# Patient Record
Sex: Male | Born: 1940 | Race: Black or African American | Hispanic: No | Marital: Single
Health system: Southern US, Community
[De-identification: ages and names within clinical notes are randomized; demographics above are authoritative.]

---

## 2004-12-04 ENCOUNTER — Inpatient Hospital Stay (HOSPITAL_COMMUNITY): Admission: EM | Admit: 2004-12-04 | Discharge: 2004-12-10 | Payer: Self-pay | Admitting: Emergency Medicine

## 2005-06-20 ENCOUNTER — Emergency Department: Payer: Self-pay | Admitting: Emergency Medicine

## 2005-12-06 ENCOUNTER — Other Ambulatory Visit: Payer: Self-pay

## 2005-12-06 ENCOUNTER — Inpatient Hospital Stay: Payer: Self-pay | Admitting: Internal Medicine

## 2005-12-12 ENCOUNTER — Ambulatory Visit: Payer: Self-pay | Admitting: Internal Medicine

## 2005-12-18 ENCOUNTER — Ambulatory Visit: Payer: Self-pay | Admitting: Internal Medicine

## 2006-01-15 ENCOUNTER — Ambulatory Visit: Payer: Self-pay | Admitting: Internal Medicine

## 2006-02-14 ENCOUNTER — Inpatient Hospital Stay: Payer: Self-pay | Admitting: Unknown Physician Specialty

## 2006-02-14 ENCOUNTER — Other Ambulatory Visit: Payer: Self-pay

## 2008-09-06 ENCOUNTER — Inpatient Hospital Stay: Payer: Self-pay | Admitting: Specialist

## 2008-11-07 ENCOUNTER — Encounter: Payer: Self-pay | Admitting: Internal Medicine

## 2008-11-08 ENCOUNTER — Inpatient Hospital Stay: Payer: Self-pay | Admitting: Internal Medicine

## 2008-11-17 ENCOUNTER — Encounter: Payer: Self-pay | Admitting: Internal Medicine

## 2008-12-18 ENCOUNTER — Encounter: Payer: Self-pay | Admitting: Internal Medicine

## 2009-01-15 ENCOUNTER — Encounter: Payer: Self-pay | Admitting: Internal Medicine

## 2009-02-15 ENCOUNTER — Encounter: Payer: Self-pay | Admitting: Internal Medicine

## 2009-02-22 ENCOUNTER — Emergency Department: Payer: Self-pay | Admitting: Unknown Physician Specialty

## 2009-03-20 ENCOUNTER — Ambulatory Visit: Payer: Self-pay | Admitting: Surgery

## 2009-03-22 ENCOUNTER — Ambulatory Visit: Payer: Self-pay | Admitting: Surgery

## 2009-03-28 ENCOUNTER — Inpatient Hospital Stay: Payer: Self-pay | Admitting: Surgery

## 2011-01-09 ENCOUNTER — Inpatient Hospital Stay: Payer: Self-pay | Admitting: Internal Medicine

## 2011-01-21 LAB — PATHOLOGY REPORT

## 2011-11-03 ENCOUNTER — Other Ambulatory Visit: Payer: Self-pay | Admitting: Internal Medicine

## 2011-11-04 ENCOUNTER — Inpatient Hospital Stay: Payer: Self-pay | Admitting: Internal Medicine

## 2011-12-19 ENCOUNTER — Ambulatory Visit: Payer: Self-pay | Admitting: Internal Medicine

## 2011-12-19 ENCOUNTER — Inpatient Hospital Stay: Payer: Self-pay | Admitting: Internal Medicine

## 2011-12-19 LAB — COMPREHENSIVE METABOLIC PANEL
Anion Gap: 11 (ref 7–16)
Calcium, Total: 9.7 mg/dL (ref 8.5–10.1)
Chloride: 107 mmol/L (ref 98–107)
Co2: 25 mmol/L (ref 21–32)
EGFR (African American): >60
EGFR (Non-African Amer.): 59 — ABNORMAL LOW
Glucose: 247 mg/dL — ABNORMAL HIGH (ref 65–99)
Potassium: 4.6 mmol/L (ref 3.5–5.1)
SGOT(AST): 17 U/L (ref 15–37)
SGPT (ALT): 22 U/L
Total Protein: 7.2 g/dL (ref 6.4–8.2)

## 2011-12-19 LAB — AMMONIA: Ammonia, Plasma: <25 mcmol/L (ref 11–32)

## 2011-12-19 LAB — CBC
HCT: 34.1 % — ABNORMAL LOW (ref 40.0–52.0)
HGB: 11.2 g/dL — ABNORMAL LOW (ref 13.0–18.0)
Platelet: 217 10*3/uL (ref 150–440)
RBC: 3.71 10*6/uL — ABNORMAL LOW (ref 4.40–5.90)
WBC: 17 10*3/uL — ABNORMAL HIGH (ref 3.8–10.6)

## 2011-12-19 LAB — URINALYSIS, COMPLETE
Bacteria: NONE SEEN
Bilirubin,UR: NEGATIVE
Glucose,UR: >=500 mg/dL (ref 0–75)
Leukocyte Esterase: NEGATIVE
Ph: 5 (ref 4.5–8.0)
Specific Gravity: 1.015 (ref 1.003–1.030)
Squamous Epithelial: <1

## 2011-12-19 LAB — CK TOTAL AND CKMB (NOT AT ARMC): CK-MB: 2.4 ng/mL (ref 0.5–3.6)

## 2011-12-19 LAB — LIPASE, BLOOD: Lipase: 70 U/L — ABNORMAL LOW (ref 73–393)

## 2011-12-20 LAB — CBC WITH DIFFERENTIAL/PLATELET
Basophil %: 0.2 %
Eosinophil #: 0 10*3/uL (ref 0.0–0.7)
Lymphocyte #: 0.8 10*3/uL — ABNORMAL LOW (ref 1.0–3.6)
Lymphocyte %: 5.9 %
MCHC: 32.9 g/dL (ref 32.0–36.0)
Neutrophil #: 12.6 10*3/uL — ABNORMAL HIGH (ref 1.4–6.5)
RDW: 14 % (ref 11.5–14.5)

## 2011-12-20 LAB — BASIC METABOLIC PANEL
Anion Gap: 15 (ref 7–16)
BUN: 34 mg/dL — ABNORMAL HIGH (ref 7–18)
Calcium, Total: 9.2 mg/dL (ref 8.5–10.1)
Chloride: 109 mmol/L — ABNORMAL HIGH (ref 98–107)
Co2: 23 mmol/L (ref 21–32)
Co2: 26 mmol/L (ref 21–32)
Creatinine: 1.64 mg/dL — ABNORMAL HIGH (ref 0.60–1.30)
Creatinine: 1.8 mg/dL — ABNORMAL HIGH (ref 0.60–1.30)
EGFR (African American): 48 — ABNORMAL LOW
EGFR (African American): 54 — ABNORMAL LOW
EGFR (Non-African Amer.): 40 — ABNORMAL LOW
EGFR (Non-African Amer.): 44 — ABNORMAL LOW
Glucose: 189 mg/dL — ABNORMAL HIGH (ref 65–99)
Glucose: 298 mg/dL — ABNORMAL HIGH (ref 65–99)
Osmolality: 311 (ref 275–301)
Potassium: 4 mmol/L (ref 3.5–5.1)
Sodium: 147 mmol/L — ABNORMAL HIGH (ref 136–145)
Sodium: 149 mmol/L — ABNORMAL HIGH (ref 136–145)

## 2011-12-20 LAB — CK TOTAL AND CKMB (NOT AT ARMC)
CK, Total: 99 U/L (ref 35–232)
CK, Total: 99 U/L (ref 35–232)
CK-MB: 1.7 ng/mL (ref 0.5–3.6)
CK-MB: 1.8 ng/mL (ref 0.5–3.6)

## 2011-12-20 LAB — TROPONIN I: Troponin-I: 0.08 ng/mL — ABNORMAL HIGH

## 2011-12-21 LAB — CBC WITH DIFFERENTIAL/PLATELET
Basophil #: 0 10*3/uL (ref 0.0–0.1)
Lymphocyte #: 1.4 10*3/uL (ref 1.0–3.6)
MCV: 92 fL (ref 80–100)
Monocyte %: 6.4 %
Neutrophil %: 81.7 %
Platelet: 212 10*3/uL (ref 150–440)
RDW: 14.3 % (ref 11.5–14.5)
WBC: 12 10*3/uL — ABNORMAL HIGH (ref 3.8–10.6)

## 2011-12-21 LAB — BASIC METABOLIC PANEL
Anion Gap: 10 (ref 7–16)
Calcium, Total: 9 mg/dL (ref 8.5–10.1)
Co2: 27 mmol/L (ref 21–32)
EGFR (African American): 55 — ABNORMAL LOW
Glucose: 233 mg/dL — ABNORMAL HIGH (ref 65–99)

## 2011-12-22 LAB — CBC WITH DIFFERENTIAL/PLATELET
HCT: 29.7 % — ABNORMAL LOW (ref 40.0–52.0)
Lymphocyte #: 2.2 10*3/uL (ref 1.0–3.6)
Lymphocyte %: 20 %
MCHC: 32.8 g/dL (ref 32.0–36.0)
Monocyte #: 0.9 10*3/uL — ABNORMAL HIGH (ref 0.0–0.7)
Neutrophil #: 7.7 10*3/uL — ABNORMAL HIGH (ref 1.4–6.5)
Platelet: 195 10*3/uL (ref 150–440)
RDW: 13.9 % (ref 11.5–14.5)

## 2011-12-22 LAB — BASIC METABOLIC PANEL
Anion Gap: 10 (ref 7–16)
BUN: 27 mg/dL — ABNORMAL HIGH (ref 7–18)
EGFR (Non-African Amer.): 49 — ABNORMAL LOW
Glucose: 201 mg/dL — ABNORMAL HIGH (ref 65–99)
Osmolality: 309 (ref 275–301)

## 2011-12-23 LAB — CBC WITH DIFFERENTIAL/PLATELET
Basophil #: 0 10*3/uL (ref 0.0–0.1)
Eosinophil #: 0.4 10*3/uL (ref 0.0–0.7)
HGB: 9.4 g/dL — ABNORMAL LOW (ref 13.0–18.0)
Lymphocyte #: 2 10*3/uL (ref 1.0–3.6)
Lymphocyte %: 21.5 %
Monocyte %: 6.1 %
Neutrophil %: 67.7 %
RBC: 3.21 10*6/uL — ABNORMAL LOW (ref 4.40–5.90)
WBC: 9.5 10*3/uL (ref 3.8–10.6)

## 2011-12-23 LAB — BASIC METABOLIC PANEL
Anion Gap: 12 (ref 7–16)
BUN: 27 mg/dL — ABNORMAL HIGH (ref 7–18)
Creatinine: 1.46 mg/dL — ABNORMAL HIGH (ref 0.60–1.30)
EGFR (African American): >60
Osmolality: 294 (ref 275–301)

## 2011-12-24 LAB — BASIC METABOLIC PANEL
BUN: 23 mg/dL — ABNORMAL HIGH (ref 7–18)
EGFR (Non-African Amer.): 57 — ABNORMAL LOW
Glucose: 248 mg/dL — ABNORMAL HIGH (ref 65–99)
Osmolality: 288 (ref 275–301)
Potassium: 3.6 mmol/L (ref 3.5–5.1)
Sodium: 138 mmol/L (ref 136–145)

## 2011-12-24 LAB — URINALYSIS, COMPLETE
Bilirubin,UR: NEGATIVE
Blood: NEGATIVE
Leukocyte Esterase: NEGATIVE
Nitrite: NEGATIVE
Ph: 5 (ref 4.5–8.0)
Squamous Epithelial: NONE SEEN

## 2011-12-24 LAB — CBC WITH DIFFERENTIAL/PLATELET
Eosinophil %: 4.4 %
HGB: 9.4 g/dL — ABNORMAL LOW (ref 13.0–18.0)
MCH: 29.8 pg (ref 26.0–34.0)
MCHC: 32.5 g/dL (ref 32.0–36.0)
MCV: 92 fL (ref 80–100)
Monocyte #: 0.5 10*3/uL (ref 0.0–0.7)
RBC: 3.15 10*6/uL — ABNORMAL LOW (ref 4.40–5.90)

## 2011-12-25 LAB — CBC WITH DIFFERENTIAL/PLATELET
Basophil #: 0 10*3/uL (ref 0.0–0.1)
Basophil %: 0.1 %
Eosinophil #: 0.3 10*3/uL (ref 0.0–0.7)
HCT: 28.6 % — ABNORMAL LOW (ref 40.0–52.0)
HGB: 9.5 g/dL — ABNORMAL LOW (ref 13.0–18.0)
Lymphocyte #: 1.5 10*3/uL (ref 1.0–3.6)
MCH: 30.1 pg (ref 26.0–34.0)
MCHC: 33.2 g/dL (ref 32.0–36.0)
Monocyte #: 0.5 10*3/uL (ref 0.0–0.7)
Neutrophil #: 6.8 10*3/uL — ABNORMAL HIGH (ref 1.4–6.5)
RBC: 3.16 10*6/uL — ABNORMAL LOW (ref 4.40–5.90)
RDW: 13.5 % (ref 11.5–14.5)
WBC: 9.1 10*3/uL (ref 3.8–10.6)

## 2011-12-25 LAB — BASIC METABOLIC PANEL
BUN: 17 mg/dL (ref 7–18)
Creatinine: 1.23 mg/dL (ref 0.60–1.30)
EGFR (Non-African Amer.): >60
Glucose: 238 mg/dL — ABNORMAL HIGH (ref 65–99)
Osmolality: 289 (ref 275–301)
Potassium: 3.5 mmol/L (ref 3.5–5.1)

## 2011-12-25 LAB — HEMOGLOBIN A1C: Hemoglobin A1C: 8.7 % — ABNORMAL HIGH (ref 4.2–6.3)

## 2011-12-30 ENCOUNTER — Emergency Department: Payer: Self-pay | Admitting: *Deleted

## 2011-12-30 LAB — COMPREHENSIVE METABOLIC PANEL
Albumin: 2.9 g/dL — ABNORMAL LOW (ref 3.4–5.0)
Anion Gap: 11 (ref 7–16)
Bilirubin,Total: 0.2 mg/dL (ref 0.2–1.0)
Creatinine: 1.38 mg/dL — ABNORMAL HIGH (ref 0.60–1.30)
Glucose: 237 mg/dL — ABNORMAL HIGH (ref 65–99)
Osmolality: 288 (ref 275–301)
Potassium: 4 mmol/L (ref 3.5–5.1)
SGOT(AST): 20 U/L (ref 15–37)
Sodium: 140 mmol/L (ref 136–145)
Total Protein: 6.5 g/dL (ref 6.4–8.2)

## 2011-12-30 LAB — URINALYSIS, COMPLETE
Ketone: NEGATIVE
Leukocyte Esterase: NEGATIVE
Nitrite: NEGATIVE
Ph: 5 (ref 4.5–8.0)
Protein: 100
WBC UR: 1 /HPF (ref 0–5)

## 2011-12-30 LAB — CBC
MCH: 30.7 pg (ref 26.0–34.0)
MCV: 91 fL (ref 80–100)
Platelet: 194 10*3/uL (ref 150–440)
RDW: 13.8 % (ref 11.5–14.5)

## 2011-12-30 LAB — LIPASE, BLOOD: Lipase: 107 U/L (ref 73–393)

## 2012-01-16 ENCOUNTER — Ambulatory Visit: Payer: Self-pay | Admitting: Internal Medicine

## 2012-04-25 ENCOUNTER — Inpatient Hospital Stay: Payer: Self-pay | Admitting: Internal Medicine

## 2012-04-25 LAB — URINALYSIS, COMPLETE
Bilirubin,UR: NEGATIVE
Glucose,UR: 150 mg/dL (ref 0–75)
Leukocyte Esterase: NEGATIVE
RBC,UR: 5 /HPF (ref 0–5)
Specific Gravity: 1.016 (ref 1.003–1.030)
WBC UR: 3 /HPF (ref 0–5)

## 2012-04-25 LAB — PROTIME-INR
INR: 1
Prothrombin Time: 13.1 secs (ref 11.5–14.7)

## 2012-04-25 LAB — CK TOTAL AND CKMB (NOT AT ARMC)
CK, Total: 1229 U/L — ABNORMAL HIGH (ref 35–232)
CK, Total: 992 U/L — ABNORMAL HIGH (ref 35–232)
CK, Total: 656 U/L — ABNORMAL HIGH (ref 35–232)

## 2012-04-25 LAB — CBC WITH DIFFERENTIAL/PLATELET
Basophil %: 0.1 %
Eosinophil %: 0 %
HGB: 11.3 g/dL — ABNORMAL LOW (ref 13.0–18.0)
Lymphocyte #: 1.5 10*3/uL (ref 1.0–3.6)
Lymphocyte %: 13 %
MCH: 30.1 pg (ref 26.0–34.0)
Monocyte %: 8.5 %
Neutrophil #: 9.2 10*3/uL — ABNORMAL HIGH (ref 1.4–6.5)
RDW: 14.6 % — ABNORMAL HIGH (ref 11.5–14.5)

## 2012-04-25 LAB — COMPREHENSIVE METABOLIC PANEL
Albumin: 3.3 g/dL — ABNORMAL LOW (ref 3.4–5.0)
Bilirubin,Total: 0.4 mg/dL (ref 0.2–1.0)
Co2: 25 mmol/L (ref 21–32)
Creatinine: 1.63 mg/dL — ABNORMAL HIGH (ref 0.60–1.30)
EGFR (African American): 48 — ABNORMAL LOW
Glucose: 158 mg/dL — ABNORMAL HIGH (ref 65–99)
Osmolality: 298 (ref 275–301)
Potassium: 4.4 mmol/L (ref 3.5–5.1)
SGOT(AST): 54 U/L — ABNORMAL HIGH (ref 15–37)
SGPT (ALT): 25 U/L

## 2012-04-26 LAB — CBC WITH DIFFERENTIAL/PLATELET
Basophil #: 0 10*3/uL (ref 0.0–0.1)
Eosinophil %: 0.3 %
HCT: 30.9 % — ABNORMAL LOW (ref 40.0–52.0)
Lymphocyte #: 1.6 10*3/uL (ref 1.0–3.6)
Lymphocyte %: 17 %
MCH: 29.7 pg (ref 26.0–34.0)
Monocyte %: 8.3 %
Neutrophil #: 7 10*3/uL — ABNORMAL HIGH (ref 1.4–6.5)
Platelet: 196 10*3/uL (ref 150–440)
RDW: 14.5 % (ref 11.5–14.5)

## 2012-04-26 LAB — MAGNESIUM: Magnesium: 1.8 mg/dL

## 2012-04-30 LAB — CULTURE, BLOOD (SINGLE)

## 2012-06-17 DEATH — deceased

## 2012-07-10 IMAGING — CT CT HEAD WITHOUT CONTRAST
2 series · 15 of 30 positions shown, 19 images · non-contrast
Comparison: none

REASON FOR EXAM: slurred speech, syncope
COMMENTS:   May transport without cardiac monitor

PROCEDURE:     CT  - CT HEAD WITHOUT CONTRAST  - January 09, 2011  [DATE]
RESULT:     Comparison:  None
TECHNIQUE: Multiple axial images from the foramen magnum to the vertex were
obtained without IV contrast.

[Series 2: without · axial · non-contrast · 0.44mm/px · z∈[+326,+456]mm · 13 of 32 slices shown, 17 images]
[im 3/32  brain]
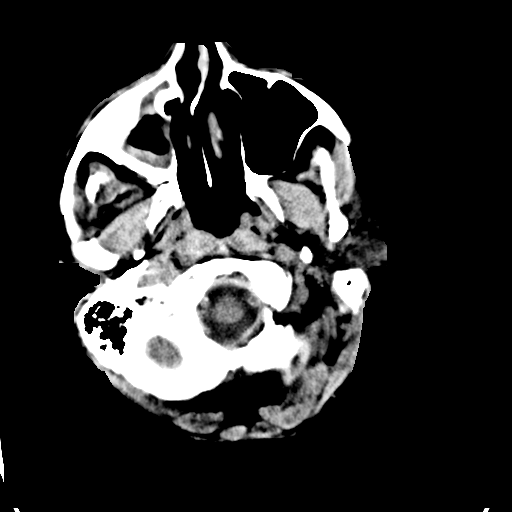
[im 3/32  bone]
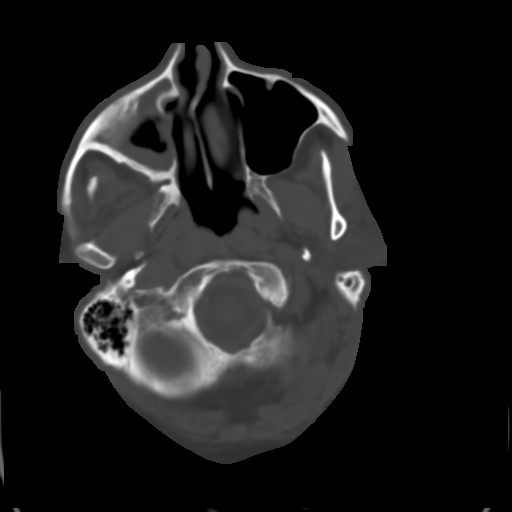
[im 5/32  brain]
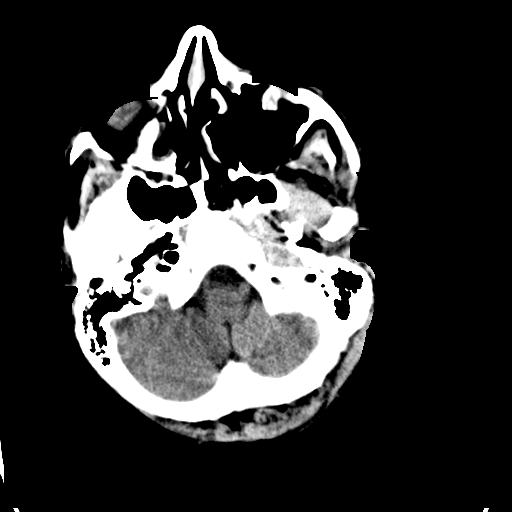
[im 7/32  brain]
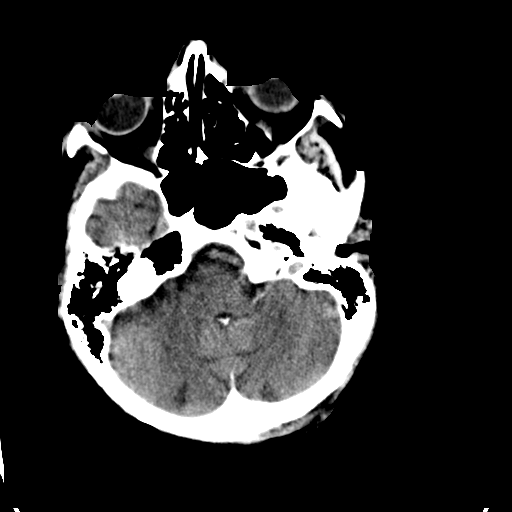
[im 9/32  brain]
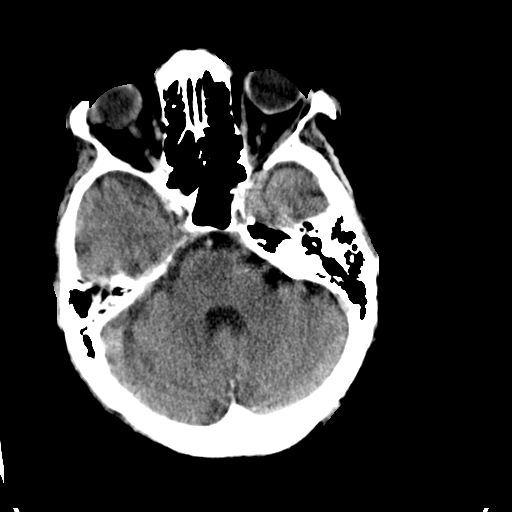
[im 12/32  brain]
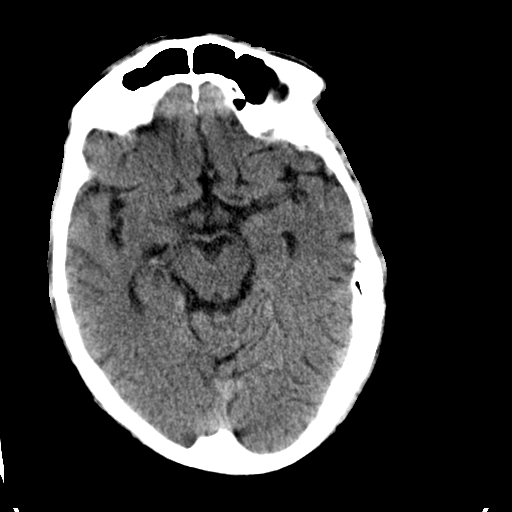
[im 12/32  bone]
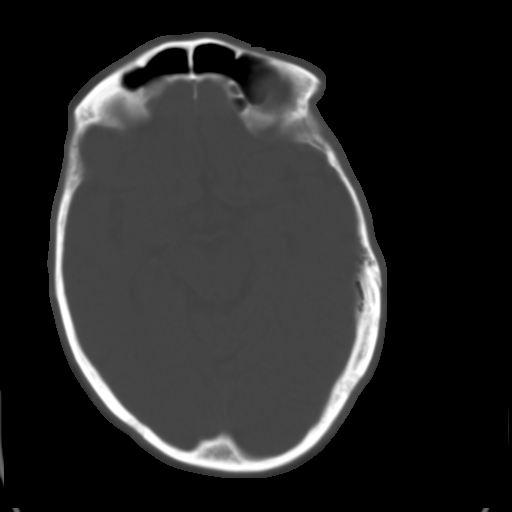
[im 14/32  brain]
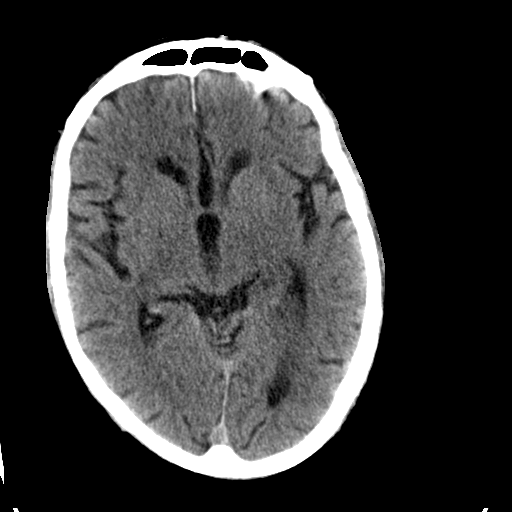
[im 16/32  brain]
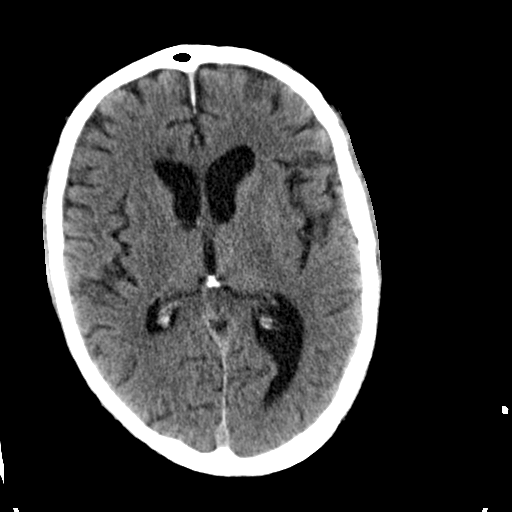
[im 18/32  brain]
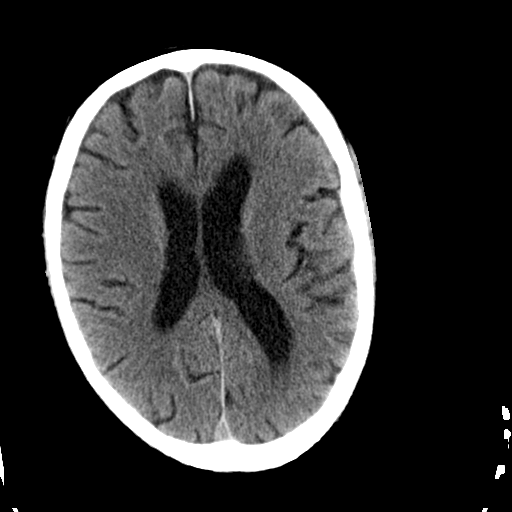
[im 20/32  brain]
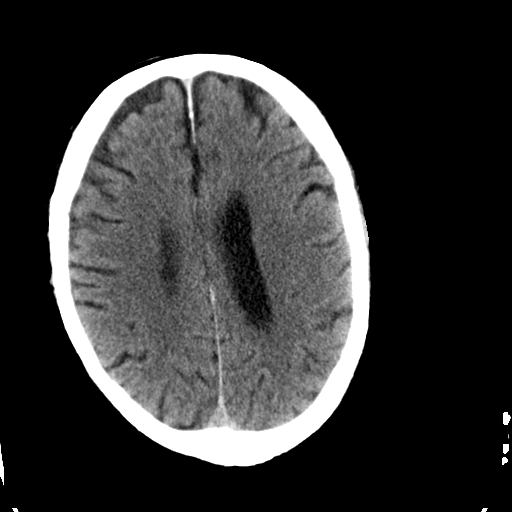
[im 20/32  bone]
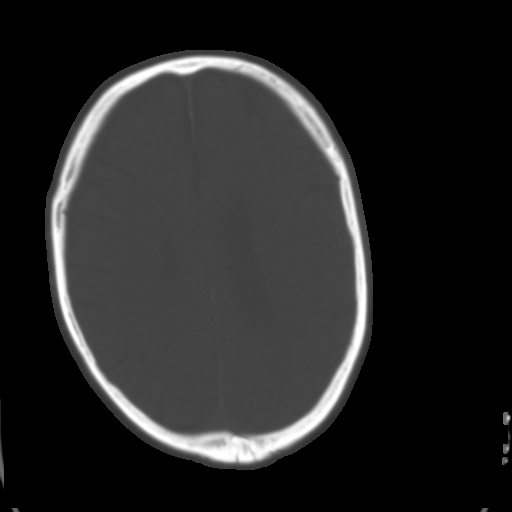
[im 23/32  brain]
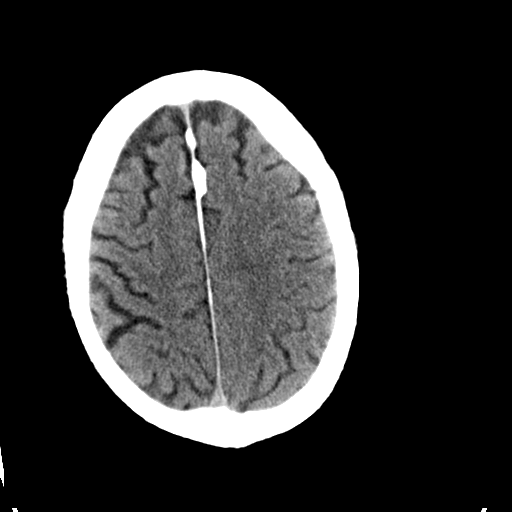
[im 25/32  brain]
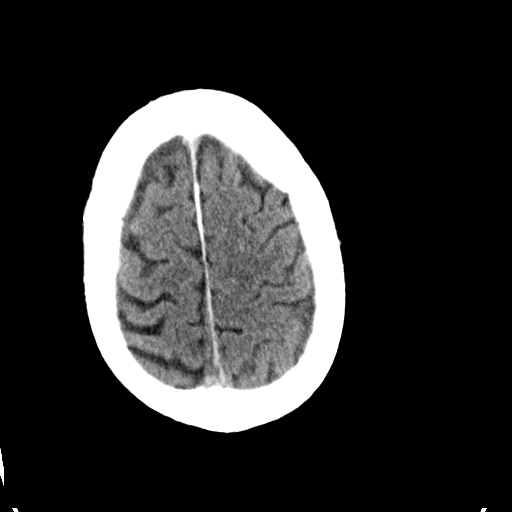
[im 27/32  brain]
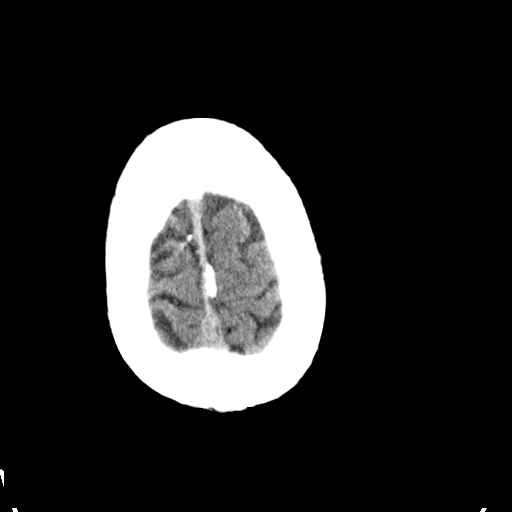
[im 29/32  brain]
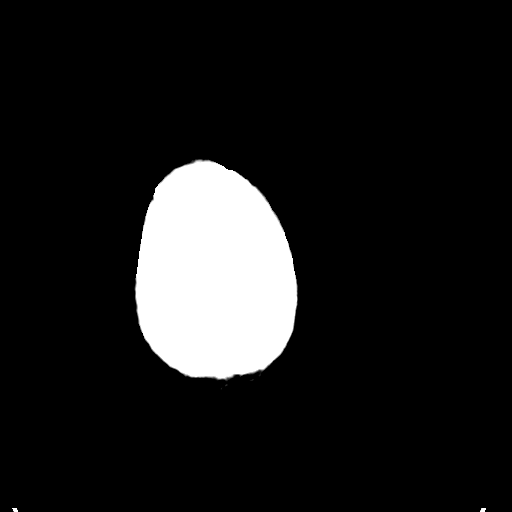
[im 29/32  bone]
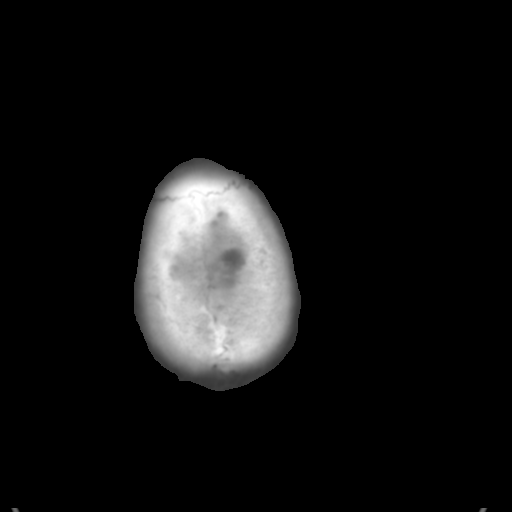

[Series 3: bone · axial · 0.44mm/px · z∈[+326,+346]mm · 2 of 32 slices shown]
[im 3/32  bone]
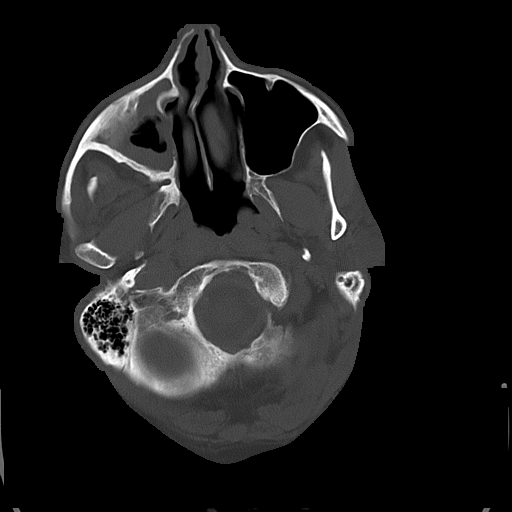
[im 7/32  bone]
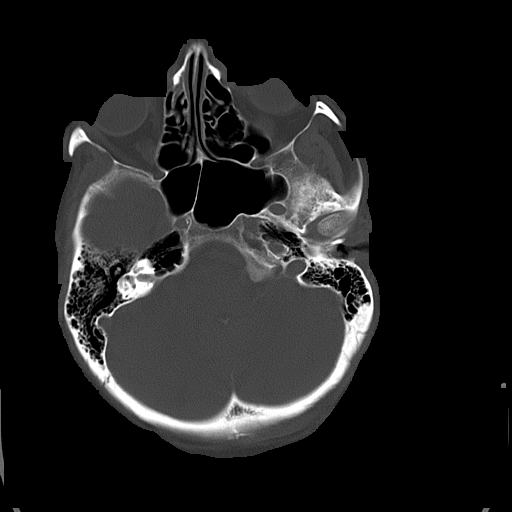

[15 of 30 positions shown; findings below may reference images not displayed]

FINDINGS: There is no evidence for mass effect, midline shift, or extra-axial fluid
collections. There is no evidence for space-occupying lesion, intracranial
hemorrhage, or cortical-based area of infarction. Minimal periventricular
hypoattenuation likely represent sequela of chronic small vessel ischemic
disease. There are calcifications in the posterior circulation.

There is near-complete opacification of the right maxillary sinus.

The osseous structures are unremarkable.
IMPRESSION: 1. No acute intracranial process.
2. Right maxillary sinus disease.

CT can underestimate ischemia in the first 24 hours after the event. If
there is clinical concern for an acute infarct, a followup MRI or repeat CT
scan in 24 hours may provide additional information.

## 2013-10-25 IMAGING — CR DG CHEST 1V PORT
1 series · 1 of 1 positions shown · non-contrast
Comparison: none

REASON FOR EXAM: altered mental status
COMMENTS:

PROCEDURE:     DXR - DXR PORTABLE CHEST SINGLE VIEW  - April 25, 2012  [DATE]
RESULT:     Comparison: 12/22/2011

[ap]
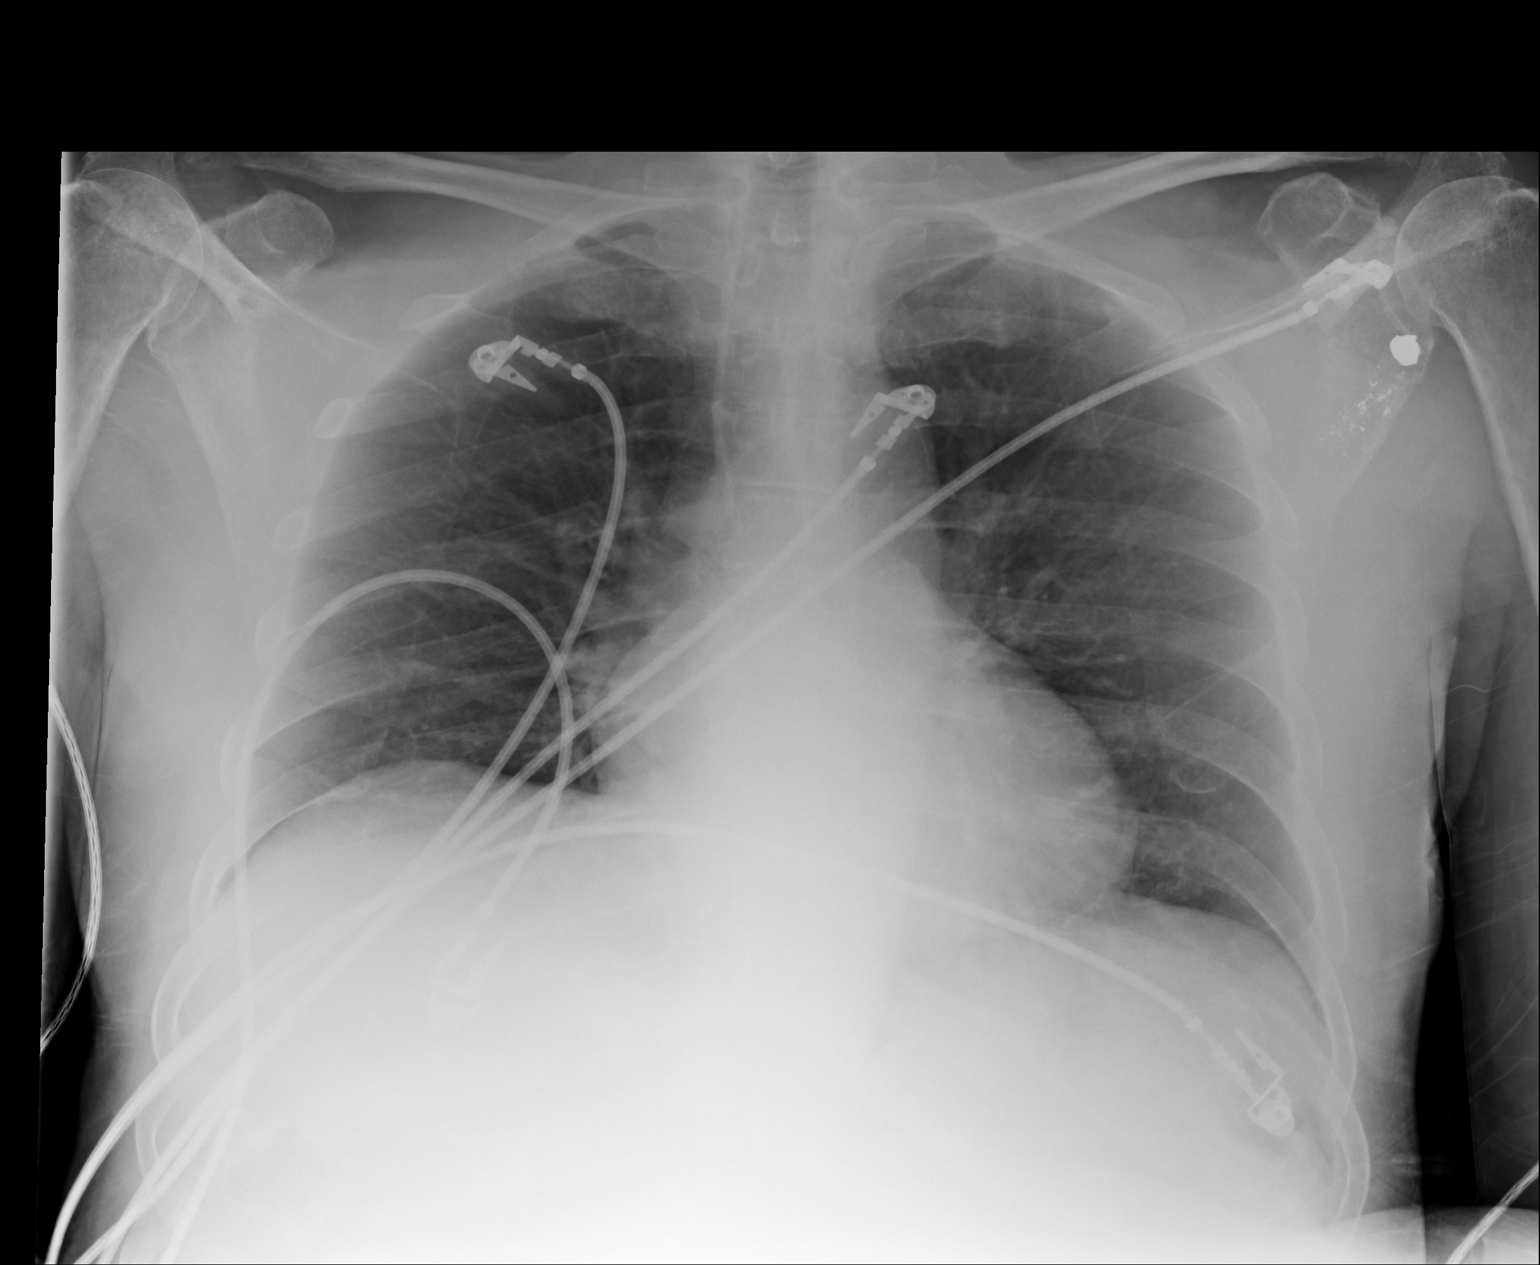

[1 of 1 positions shown; findings below may reference images not displayed]

FINDINGS: Single portable AP chest radiograph is provided.  There is no focal
parenchymal opacity, pleural effusion, or pneumothorax. Normal
cardiomediastinal silhouette. The osseous structures are unremarkable.
IMPRESSION: No acute disease of the che[REDACTED]

## 2015-03-11 NOTE — Discharge Summary (Signed)
PATIENT NAME:  Cameron Garcia, Cameron Garcia MR#:  [id number] DATE OF BIRTH:  12-17-1940  DATE OF ADMISSION:  04/25/2012 DATE OF DISCHARGE:  04/26/2012   ADMISSION DIAGNOSIS: Altered mental status.   DISCHARGE DIAGNOSES: 1. Altered mental status likely secondary to a combination of dementia, medications, and hypertension, accelerated.  2. Accelerated hypertension.  3. Alzheimer's dementia.   CONSULTS: None.   LABORATORIES AT DISCHARGE: White blood cells 9.4, hemoglobin 10, hematocrit 31, platelets 196. INR 1.0. Magnesium 1.8.   Chest x-ray showed no acute cardiopulmonary disease.   CT of the head showed no acute intracranial hemorrhage or CVA.   Blood cultures and urine culture were negative to date.   HOSPITAL COURSE: The patient is a 74 year old male who was sent from the [facility] with altered mental status. For further details, please refer to the history and physical.  1. Altered mental status concerning for hypertension encephalopathy versus NMS versus serotonin syndrome as the patient was on Effexor and trazodone. Dr. Guss Bundehalla from Psychiatry was consulted. She felt that perhaps this could be due to his medications, however, so she wanted to discontinue all of the psych meds including Effexor, trazodone, and anti-psychotics. However, it does seem that the patient has baseline tremors and rigidity. His CPK was elevated. This could be due primarily to rhabdomyolysis. However, Dr. Guss Bundehalla did feel that the patient may have neuroleptic malignant syndrome. His CT of the head was negative. Chest x-ray and urinalysis were also negative as well as blood cultures. The patient is now at his baseline.  2. Elevated CPK, rhabdo versus psych meds. CPK has been trending down with IV fluids.  3. Hypertension encephalopathy. Blood pressure noted to be elevated which could be contributing to problem #1. CT of the head is negative. His blood pressure is controlled.  4. Dementia with mood swings.   5. Chronic pain, on fentanyl.  6. Diabetes. The patient can resume his outpatient medications.   DISCHARGE MEDICATIONS: The patient will stop taking trazodone 100 mg at bedtime, clonazepam, Effexor, Haldol, and Risperdal for now.  The patient will be discharged with:  1. Clonidine 0.2 mg once a week on Tuesday along with clonidine 0.3 mg for a total of 0.5 mg. 2. Hydralazine 100 mg t.i.d.   3. Norvasc 10 mg at bedtime.  4. Atorvastatin 20 mg at bedtime.  5. Pantoprazole 40 mg daily.  6. Bethanechol 10 mg 1 tablet t.i.d.  7. Magnesium oxide 400 mg b.i.d.  8. Depakote 250 1 tablet b.i.d.  9. Tamsulosin 0.4 mg daily.  10. Nitroglycerin 0.2 transdermal film patch on in the morning and off in the evening.  11. Enalapril 20 mg daily.  12. Tylenol 325 2 tablets q.4 hours p.r.n. pain.  13. Vitamin D3 1000 international units daily.  14. Lantus 10 units at bedtime.  15. Sliding scale insulin as directed.  16. Metoprolol 100 mg daily.  17. Fentanyl patch 25 mcg q.72 hours.   DISCHARGE DIET: Mechanical soft.   DISCHARGE ACTIVITY: As tolerated.   DISCHARGE REFERRAL: Physical therapy.  FOLLOW-UP: Follow-up with Dr. Toy CookeyErnest Garcia in one week.      TIME SPENT: Approximately 35 minutes.  ____________________________ Janyth ContesSital P. Juliene PinaMody, MD spm:drc D: 04/26/2012 13:44:50 ET T: 04/26/2012 14:15:01 ET JOB#: [id number]  cc: Taliyah Watrous P. Juliene PinaMody, MD, <Dictator> Serita ShellerErnest B. Maryellen PileEason, MD Janyth ContesSITAL P Francis Yardley MD ELECTRONICALLY SIGNED 04/28/2012 12:27

## 2015-03-11 NOTE — Consult Note (Signed)
Comments   Palliative Care team met with pt's sisters, Rick Duff and Marolyn Haller, who are shared HCPOA's (document in [facility]). Also present were other family members/friends. Updated them on pt's current medical status. Pt is much more alert this afternoon and eating. Family feels that he is approaching his baseline. They plan for pt to return to [facility] at discharge. We discussed code status. Pt's living will is also in SCM. Sisters say that pt wants to be a DNR and they are in agreement with this. Order entered and out-of-facility DNR completed and placed in pt's  chart.  expressed appreciation for meeting. All questions answered.   Electronic Signatures: Adreonna Yontz, Izora Gala (MD)  (Signed 04-Feb-13 15:59)  Authored: Palliative Care   Last Updated: 04-Feb-13 15:59 by Zaakirah Kistner, Izora Gala (MD)

## 2015-03-11 NOTE — Discharge Summary (Signed)
PATIENT NAME:  Cameron Garcia, Cameron Garcia MR#:  [id number] DATE OF BIRTH:  [birth year]  DATE OF ADMISSION:  12/19/2011 DATE OF DISCHARGE:  12/25/2011  ADMITTING PHYSICIAN: Enedina Finner, MD  DISCHARGING PHYSICIAN: Larena Glassman, MD  PRIMARY CARE PHYSICIAN: Toy Cookey, MD  REASON FOR ADMISSION: Altered mental status, lethargy.   DISCHARGE DIAGNOSES:  1. Right lower lobe pneumonia. 2. Altered mental status and lethargy, most likely due to pneumonia.  3. Systemic inflammatory response syndrome. 4. Metabolic encephalopathy. 5. Hypernatremia. 6. Dysphagia. 7. Acute renal failure, resolved.  8. Anemia. 9. Chronic gastrointestinal blood loss.  10. Elevated troponin, likely due to anemia, tachycardia, and sepsis.  11. Accelerated hypertension.  12. Hyperglycemia/diabetes.  A1c was found to be 8.7.  13. Urinary retention.   CONSULTANTS:  1. Palliative Care.  2. Case Management.  3. Physical Therapy.  4. Occupational Therapy.  5. Speech and Language Pathology.   TESTS DURING HOSPITALIZATION: Chest x-ray on 12/19/2011 showed right lower lobe airspace disease concerning for pneumonia.   CT of the head without contrast on 12/19/2011 showed no acute intracranial process.   Repeat chest x-ray on 12/22/2011 showed hazy right lower lobe airspace disease with appearance concerning for pneumonia.   HOSPITAL COURSE: Initial history and physical were done by Dr. Allena Katz. Please refer to her note dated 12/19/2011 for complete details. In brief, this is a 74 year old African American male with history of multiple medical problems which include pneumonia in the past, encephalopathy, acute renal failure, anemia of chronic blood loss, esophageal strictures, coronary artery disease, and dementia who presented with lethargy, low grade fever, and tachycardia, and was found to have right lower lobe pneumonia, possible aspiration. The patient was admitted to the hospitalist service.  1. Systemic inflammatory response  syndrome from right lower lobe pneumonia from aspiration: The patient was continued on oxygen. He received seven days of Zyvox. He was on Zosyn and switched over to [place], which he received five days of to cover aspiration pneumonia. WBC was normalized. He had no fever. After initiation of antibiotics, the patient had cultures of the blood which showed no growth. His mental status improved. He did have some dysphagia with regular diet. Speech and Language recommended dysphagia diet, honey-thickened. On discharge the patient was switched over to Augmentin 500 mg p.o. twice a day.  2. Altered mental status, likely metabolic encephalopathy: We monitored and this improved with treatment of his pneumonia.  3. Hypernatremia: This improved with D5 water. 4. Dysphagia: Speech and swallow evaluation recommended dysphagia I, pureed honey-thickened diet. 5. Acute renal failure: We avoided nephrotoxins, most likely from systemic inflammatory response syndrome. This improved with IV fluids and avoiding nephrotoxins.  6. Anemia from chronic gastrointestinal blood loss: This was stable. 7. Elevated troponin, likely due to anemia and tachycardia from demand ischemia or sepsis: No anticoagulation was required as this was minor elevation. 8. Accelerated hypertension: The patient was on labetalol, hydralazine, clonidine patch, and Norvasc. This improved.  9. Hyperglycemia: Hemoglobin A1c was found to be 8.7. The patient was resumed on his Lantus and sliding scale insulin.  10. Palliative Care evaluated the patient and the patient was made DO NOT RESUSCITATE. 11. DVT prophylaxis: He was maintained with SCDs. 12. Urinary retentiion.  Patient had [name] retention started flomax will discharge with foley.  Would try remvoing foley ion 1-2 days and do bladder scans.    PHYSICAL EXAMINATION:   VITALS: The patient was discharged on 12/25/2011 with a temperature of 97.1, heart rate 81, respiratory rate 19, blood pressure is  now 145/87, and he is saturating 98% on room air.   LUNGS: Clear to auscultation.   CARDIOVASCULAR: Regular rate and rhythm.   ABDOMEN: Benign.   DISCHARGE MEDICATIONS:  1. Atorvastatin 20 mg daily.  2. Catapres-TTS 0.3 mg transdermal one patch once a week.  3. Protonix 40 mg daily.  4. Zofran 4 mg every six hours as needed for nausea and vomiting. 5. Insulin glargine 10 units subcutaneous once at bedtime.  6. Insulin aspartate 100 units/mL; blood sugar 151 to 200 two units, 201 to 250 four units, 251 to 300 six units, 301 to 350 eight units, 351 to 400 ten units. 7. Risperidone 1 mg every 12 hours as needed for psychosis. 8. Risperdal Consta 50 mg every two weeks intramuscular  injection, one syringe, 50 mg IM every two weeks at 9 o'clock  a.m. 9. Trazodone 50 mg 1 tablet once a day. 10. Hydralazine 100 mg three times daily. 11. Nitroglycerin 0.2 mg transdermal one patch once a day, off in the evening for angina, rotate.  12. Vitamin D3 50,000 international units 1 tablet monthly. 13. Sertraline 50 mg in the morning.  14. Oxygen 2 liters.  15. Tylenol 325 mg 2 tablets every four hours as needed for pain.  16. Ventolin HFA 90 mcg inhaled 2 puffs every four hours as needed.  17. Albuterol ipratropium 2.5/0.5 mg, 3 mL one vial nebulizer four times daily.  18. Amlodipine 10 mg daily.  19. Toprol-XL 100 mg twice a day. 20. Enalapril 20 mg daily. 21. Ferrous sulfate 325 mg one tablet twice a day.  22. Vitamin D 50,000 international units once a month.  23. Augmentin 500 mg p.o. twice a day, #14 tablets.  24. Mucinex 600 mg p.o. twice a day, #14 tablets.  25. Flomax 0.4mg  daily   OXYGEN: 2 liters nasal cannula prn.  Foley Catheter: try to remvove in -12 days , do bladder scan after removing.   DISCHARGE DIET: ADA, puree, renal diet, dysphagia I, puree/ honey thickened.   ACTIVITY: As tolerated with assistance.   CODE STATUS: DO NOT RESUSCITATE.   DISCHARGE FOLLOWUP: The patient  is to see Dr. Toy CookeyErnest Eason in one week.  Thank you for asking me to participate in the care of this patient.   TIME SPENT ON DISCHARGE: 50 minutes.  ____________________________ Corie ChiquitoAmir A. Lafayette DragonFirozvi, MD aaf:slb D: 12/25/2011 10:53:51 ET T: 12/25/2011 11:14:02 ET JOB#: [id number]  cc: Karolee OhsAmir A. [place] DragonFirozvi, MD, <Dictator> Serita ShellerErnest B. Maryellen PileEason, MD Ned GraceNancy Phifer, MD Coralyn PearAMIR A Caydan Mctavish MD ELECTRONICALLY SIGNED 12/26/2011 10:13

## 2015-03-11 NOTE — H&P (Signed)
PATIENT NAME:  Carolynn Garcia, Cameron MR#:  [id number] DATE OF BIRTH:  08/05/1941  DATE OF ADMISSION:  12/19/2011  PRIMARY CARE PHYSICIAN: Toy CookeyErnest Eason, MD [facility] Previously her PCP was [facility].   CHIEF COMPLAINT: History is obtained from the patient's old records, nursing home notes, and the patient's family. The patient is lethargic and unable to give any review of systems or  history.   HISTORY OF PRESENT ILLNESS: Mr. Cameron Garcia is a 74 year old African American gentleman with multiple medical problems who comes to the Emergency Room from [facility] for fever, responding well, and not being his own self. He has been having some symptoms of cough for the past few days. In the Emergency Room, the patient was found to have fever of 99.3, he was tachycardic with heart rate in the 130s, his white count was 17.2, and chest x-ray showed right lower lobe pneumonia. He was recently admitted 12/18 to 11/09/2011 with similar symptoms of pneumonia and had evaluation done by speech therapy which did not show any signs of overt aspiration. He also underwent endoscopy at that time with esophageal stricture with dilatation by Dr. Bluford Kaufmannh.  The patient is very lethargic, moans and groans on sternal rub. He does not give any history. He has received IV Zosyn and IV Zyvox for possible aspiration pneumonia versus healthcare acquired pneumonia. The patient is being admitted for further evaluation and management.   PAST MEDICAL HISTORY:  1. Right lower lobe pneumonia, admitted from 12/18 to 11/09/2011.  2. Encephalopathy secondary to pneumonia.  3. Acute on chronic renal failure.  4. Anemia due to chronic blood loss, status post EGD without any evidence of upper GI bleed. The patient underwent esophageal stricture dilatation. Colonoscopy was not done and needs to be done at a later date. 5. Esophageal stricture status post dilatation, 11/08/2011.  6. Coronary artery  disease.  7. History of cerebrovascular accident with dysphagia. 8. Protein calorie malnutrition.  9. Hyperlipidemia.  10. Occlusion and stenosis of carotid arteries status post cerebrovascular accident.  11. Vitamin D deficiency.  12. Type 2 diabetes.   PAST SURGICAL HISTORY: (Per old records) 1[facility]  2. Multiple surgeries for diverticular disease.  3. Colostomy in 2009.  4. Reversal of colostomy in May 2010. 5. Surgery Fournier's gangrene.  ALLERGIES: Ativan.   MEDICATIONS:  1. Risperidone 1 mg twice a day. 2. Sertraline 50 mg one in the morning.  3. Toprol-XL 100 mg twice a day. 4. Trazodone 50 mg at bedtime.  5. Tylenol 325 mg 2 tablets every four hours as needed.  6. Ventolin HFA 90 mcg 2 puffs inhaled every four hours as needed.  7. Vitamin D3 50,000 international units once a month. 8. Vitamin D 50,000 international units oral capsule once a month.  9. DuoNebs every 4 hours p.r.n.  10. Amlodipine 10 mg daily.  11. Atorvastatin 20 mg daily.  12. Catapres TTS 0.3, one patch transdermal once a week.  13. Enalapril 20 mg daily.  14. Ferrous sulfate 325 mg p.o. twice a day.  15. Hydroxyzine 100 mg three times daily. 16. Insulin sliding scale.  17. Insulin glargine (Lantus) 10 units at bedtime.  18. Nitroglycerin 0.2 mg/hour transdermal film daily, in the morning and off in the evening.  19. Zofran 4 mg as needed.  20. Oxygen 2 liters per minute.  21. Protonix 40 mg daily.  22. Promethazine suppository 25 mg as needed.  23. Risperdal 50 mg intravascular injection one  injection every two weeks.   REVIEW OF SYSTEMS:  Unobtainable.   FAMILY HISTORY: Obtained from old records suggestive for coronary artery disease.   PHYSICAL EXAMINATION:   GENERAL: The patient is lethargic, responds with moaning and coughing with sternal rub.   VITAL SIGNS: Temperature 99.3, pulse 132, blood pressure 109/67, and saturation 99% on room air.   HEENT: Atraumatic,  normocephalic. Pupils are equal, round, and reactive to light and accommodation. Extraocular movements intact. Oral mucosa is dry.   NECK: Supple. No JVD. No carotid bruit.   RESPIRATORY: Decreased breath sounds in the bases. No respiratory distress, labored breathing, audible wheezing, or crackles heard.   CARDIOVASCULAR: Tachycardia present. Both heart sounds are normal. No murmur heard.  ABDOMEN: Soft, benign. No organomegaly noted. Hyperactive bowel sounds.   NEURO: Unable to assess. The patient is lethargic, moves extremities spontaneously.   SKIN: Warm and dry.   PSYCHIATRIC: Unable to assess. The patient is lethargic.   LABS/STUDIES: Chest x-ray is suggestive of developing right lower lobe infiltrate.   CT of the head without contrast shows no acute abnormality.   Urinalysis is negative for urinary tract infection.   White count 17.0, hemoglobin and hematocrit 11.2 and 34.1, platelet count 217. Lipase 17, glucose 247, BUN 33, creatinine 1.28, sodium 143, potassium 4.6, chloride 107, bicarbonate 25, calcium 9.7, bilirubin 0.3, alkaline phosphatase 94, SGPT 22, SGOT 17. Cardiac enzymes, first set, is negative, except troponin of 0.07.   ASSESSMENT: 74 year old Mr. Cameron Garcia with:  1. Systemic inflammatory response syndrome due to right lower lobe pneumonia, suspect aspiration versus health-care associated pneumonia.  2. Encephalopathy suspected due to pneumonia versus medications. 3. Anemia of chronic disease, hemoglobin stable.  4. Dementia, appears vascular.  5. Previous history of cerebrovascular accident with symptoms of dysphagia, chronic.  6. Esophageal stricture status post dilatation on 11/08/2011 by Dr. Bluford Kaufmann. 7. Coronary artery disease, medical treatment.  8. Type 2 diabetes.   PLAN:  1. Admit the patient to medical floor, on telemetry. 2. FULL CODE for now.  3. IV fluids for hydration.  4. NPO except medications.  5. We will check plasma ammonia levels.  6. Follow  blood cultures and then follow CBC and metabolic panel.  7. Continue IV Zyvox and Zosyn. Once the patient clinically improves, narrow down antibiotics. 8. I will hold off on p.o. medications right now. The patient is very lethargic. We will resume once the patient is alert.  9. Speech therapy again for possible aspiration given location of pneumonia.  10. Physical therapy.  11. Care management for discharge planning.  12. We will have palliative care consultation for recurrent admissions and similar problems and goals of treatment.              The above plan was discussed with the patient's sister, Theora Gianotti, who is the HPOA [phone]). She understands the patient's complex medical problems and critical illness.   CRITICAL TIME SPENT: 60 minutes. ____________________________ Wylie Hail Allena Katz, MD sap:slb D: 12/19/2011 19:54:59 ET T: 12/20/2011 08:30:41 ET JOB#: [id number]  cc: Marcie Shearon A. Allena Katz, MD, <Dictator> Serita Sheller. Maryellen Pile, MD Willow Ora MD ELECTRONICALLY SIGNED 12/20/2011 15:42

## 2015-03-11 NOTE — Consult Note (Signed)
PATIENT NAME:  Cameron Garcia, Cameron Garcia MR#:  [id number] DATE OF BIRTH:  08-Jul-1941  DATE OF CONSULTATION:  11/07/2011  REFERRING PHYSICIAN:  Dr. Juliene Pina with [facility]  CONSULTING PHYSICIAN:  Dwayne D. Callwood, MD  INDICATION: Possible non-Q-wave myocardial infarction, renal insufficiency.   HISTORY OF PRESENT ILLNESS: Cameron Garcia is a 74 year old male with severe dementia, cerebrovascular accident residual aphasia, hypertension, chronic renal disease stage III, resident of a nursing home who presented with complaints of low hemoglobin of 6.5; got a transfusion. Over 24 hours after transfusion he had some respiratory problems with low oxygenation and was treated with Solu-Medrol and clindamycin and Levaquin but was sent for further evaluation. Patient appeared to be lethargic but history is difficult because of his dementia and his aphasia so he was admitted with what appeared to be lethargy, altered mental status, shortness of breath, anemia.   REVIEW OF SYSTEMS: No blackout spells, syncope. No nausea, vomiting. No fever. No chills. No sweats. No weight loss. No weight gain. No hemoptysis, hematemesis. No bright red blood per rectum. He complained of shortness of breath, weakness, fatigue.   PAST MEDICAL HISTORY:  1. Dysphagia. 2. Cerebrovascular accident. 3. Hypertension. 4. Renal insufficiency. 5. Peripheral disease. 6. Protein calorie malnutrition.  7. Hyperlipidemia.  8. Reflux.  9. Coronary artery disease.  10. Diabetes.  MEDICATIONS: 1. Aspirin 81 mg daily.  2. Atorvastatin 20 a day.  3. Catapres TTS weekly.  4. DuoNebs every four hours p.r.n.  5. Ferrous sulfate 325 mg daily.  6. Fleets enema. 7. Hydralazine 100 mg 3 times a day. 8. Lantus 15 at bedtime. 9. Magnesium oxide 400 twice a day.  10. Levaquin once a day.  11. Nitroglycerin patch 0.2 daily. 12. Norvasc 10 a day.  13. Sliding scale insulin.  14. Plavix 75 a day.  15. Protonix 40. 16. Zantac 150 twice a day.   17. Risperdal 0.25 twice a day and 0.5 mg at bedtime.  18. Toprol 100 daily.  19. Vasotec 20 a day.  20. Vitamin D 5000 units. 21. Zoloft 50 daily. 22. Haldol 0.2. q.2 hours p.r.n.  23. 2 liters of oxygen.  24. Tylenol 650 q.4 p.r.n.   FAMILY HISTORY: Positive for coronary artery disease.   SOCIAL HISTORY: Resident of a nursing home. No smoking, alcohol consumption. Used to smoke cigarettes and marijuana but no alcohol.   PAST SURGICAL HISTORY: 1. Colectomy at [facility]. 2. Diverticular disease. 3. Diverting colostomy. 4. Reversal of colostomy.   PHYSICAL EXAMINATION: VITAL SIGNS: Blood pressure 150/70, pulse 99, respiratory 16, afebrile.   HEENT: Normocephalic, atraumatic. Pupils reactive to light.   NECK: Supple. No jugular venous distention, bruits, adenopathy.   LUNGS: Clear with bilateral rhonchi, some faint expiratory wheezing. No rales.   HEART: Distant heart sounds. No significant murmur, gallops, or rubs.   ABDOMEN: Benign.   EXTREMITY: Within normal limits.   NEUROLOGIC: Grossly intact. He has aphasia so exam was difficult. He had 4/5 strength in his legs.   LABORATORY, DIAGNOSTIC AND RADIOLOGICAL DATA: White count 7, hemoglobin 6.5 initially, hematocrit 20, platelet count 250. LFTs are negative. Sodium 141. Troponin increased to 3. Hemoglobin improved after transfusion. PT/INR normal. Chest x-ray shows consolidate, infiltrate in the right lower lobe suggestive of pneumonia. EKG: Normal sinus rhythm with slight sinus tachycardia, nonspecific findings.   ASSESSMENT:  1. Possible non-Q-wave myocardial infarction. 2. Pneumonia. 3. Peripheral disease. 4. History of transient ischemic attack. 5. History of cerebrovascular accident. 6. Renal insufficiency with creatinine of 1.7.  7. Diabetes.  8.  Dementia.  9. Hypertension.  10. Hyperlipidemia.  11. Anemia possible from GI bleeding.   PLAN: Agree with admit. Agree with GI work-up. Follow up cardiac enzymes. Follow  up EKG. I am concerned that he may need echocardiogram. Would hold off on anticoagulation. Follow up troponins. Do not recommend cardiac catheterization now because of comorbid disease. I think if we treat him medically with calcium blockers and beta blockers he should be an acceptable risk for EGD. Because of his profound anemia I think EGD is warranted. I do not believe cardiac catheterization would add significantly to his prognosis and treatment. I do not believe would recommend angioplasty or bypass so we will treat the patient medically anyway. Again GI should proceed with scope at their convenience whether now or later on. Continue to hold ACE inhibitor because of renal influence. May discontinue Plavix and discontinue aspirin because of the bleeding. Continue diabetes management. Base further evaluation on how he does but recommend medical therapy for his elevated troponins and do not recommend any further medical or cardiology therapy beyond continuing beta blockers pre- and postendoscopy.    ____________________________ Bobbie Stackwayne D. Juliann Paresallwood, MD ddc:cms D: 11/07/2011 13:39:51 ET T: 11/07/2011 15:26:12 ET JOB#: [id number]  cc: Dwayne D. Juliann Paresallwood, MD, <Dictator> Alwyn PeaWAYNE D CALLWOOD MD ELECTRONICALLY SIGNED 11/27/2011 5:57

## 2015-03-11 NOTE — Discharge Summary (Signed)
PATIENT NAME:  Cameron Garcia, Cameron Garcia MR#:  [id number] DATE OF BIRTH:  17-Sep-1941  DATE OF ADMISSION:  11/04/2011 DATE OF DISCHARGE:  11/09/2011  ADMITTING DIAGNOSES: Lethargy, anemia, and pneumonia with hypoxia.   DISCHARGE DIAGNOSES:  1. Shortness of breath due to likely aspiration pneumonia status post treatment with Zosyn and Levaquin. His WBC normalized. His last chest x-ray done on 11/08/2011 shows resolving pneumonia. The patient is currently off oxygen. 2. Acute encephalopathy likely due to combination of pneumonia as well as severe anemia, now the patient is at baseline.  3. Acute on chronic renal failure. The patient's renal function is back to baseline.  4. Anemia possibly due to chronic blood loss with component of anemia of chronic disease status post esophagogastroduodenoscopy with no evidence of upper gastrointestinal bleed. The patient will be reevaluated as an outpatient for possible colonoscopy.  5. Esophageal stricture status post dilation during esophagogastroduodenoscopy. 6. Elevated troponin felt to be due to the patient's severe anemia, possible demand ischemia, status post evaluation by Dr. Juliann Paresallwood who recommended continuing medical regimen. Due to the patient's gastrointestinal bleeding, he felt that the patient would not be able to be placed on any antiplatelets. Therefore doing a cardiac catheterization would not help.  7. Possible urinary tract infection on presentation. Cultures not obtained; however, any bacteria should have been covered with treatment for pneumonia.  8. Hypertension.  9. Previous history of cerebrovascular accident with dysphagia.  10. Peripheral angiopathy.  11. Protein caloric malnutrition.  12. Hyperlipidemia.  13. History of reflux esophagitis.  14. Occlusion and stenosis of the carotid arteries status post cerebrovascular accident.  15. Coronary artery disease.  16. Vitamin D deficiency.  17. Diabetes type 2.   CONSULTANTS: 1. Barnetta ChapelMartin Skulskie,  MD. 2. Lutricia FeilPaul Oh, MD. 3. Dorothyann Pengwayne Callwood, MD.  LABS/STUDIES: Admitting WBC count 7.0, hemoglobin 6.5, and platelet count 215. Sodium 141, potassium 4.5, chloride 109, bicarbonate 20, BUN 41, creatinine 1.70, glucose 147, and calcium 9.2. LFTs otherwise were normal, except albumin of 3.3. INR 1.2. Troponin 0.84.   Chest x-ray showed consolidative infiltrate of the right lower lobe.   EKG showed sinus tachycardia. No ST-T wave changes.   Most recent hemoglobin was 9.5, on 11/09/2011. The patient's creatinine level on 11/06/2011 was 1.30. Troponin got as high as 3.01. Ferritin 75. Iron level was low at 26.   HOSPITAL COURSE: Please see the History and Physical done by the admitting physician. The patient is a 74 year old African American male who has a history of coronary artery disease and previous cerebrovascular accident who was sent from the skilled nursing facility for lethargy, hypoxia, and anemia. The patient was evaluated in the ED and was noted to have a hemoglobin of 6.5. He was also noticed to have a right lower lobe pneumonia. Therefore, we were asked to admit the patient. Due to his history of dysphagia, previous cerebrovascular accident, and his lethargy there was concern for aspiration pneumonia so he was treated with Zosyn and subsequently Levaquin was added. With these treatments the patient's pulmonary symptoms resolved. His recent chest x-ray repeated on 11/08/2011 shows improvement in the right lower lobe infiltrate. The patient otherwise is doing much better. He was also noted to be anemic on presentation. He had iron deficiency anemia. He was also noted to have guaiac-positive stools. He was seen by Gastroenterology and he underwent an EGD this morning which showed esophageal stricture which was dilated.  No upper GI bleeding was noticed. The patient also will be evaluated as an outpatient for possible  colonoscopy by Dr. Bluford Kaufmann. He also was noted to have elevated cardiac enzymes. He was seen  by Dr. Juliann Pares who felt that doing intervention with cardiac catheterization is not recommended, with the patient being significantly anemic and he will not be able to place him on any antiplatelet therapy, and he recommended medical management and GI work-up. The patient does not have any chest pain currently. He is doing much better and is stable for discharge. We will repeat a hemoglobin in one week at the skilled nursing facility to make sure his hemoglobin is not dropping further.   DISCHARGE MEDICATIONS:  1. Atorvastatin 20 mg daily.  2. Catapres-TTS 3 change weekly.  3. Albuterol and Atrovent nebulizers four times daily p.r.n.  4. Hydralazine 100 mg three times daily. 5. Nitroglycerin 0.2 mg/h transdermally, change daily.  6. Amlodipine 10 mg daily.  7. Insulin sliding scale.  8. Risperdal 0.25 mg once in the morning. 9. Risperdal 0.5 mg in the evening. 10. Toprol-XL 1 tab p.o. twice a day. 11. Vitamin D3 50,000 international units once a month. 12. Sertraline 50 mg one tab p.o. daily.  13. Haloperidol 0.2 mL IM every 2 hours p.r.n.  14. Tylenol 650 mg every four hours p.r.n. for pain.  15. Ventolin 2 puffs every 4 hours p.r.n.  16. Iron sulfate 325 mg p.o. twice a day. 17. Lantus 10 units subcutaneous at bedtime.  18. Protonix 40 mg daily.  19. Zofran 4 mg every 6 hours p.r.n.  20. Levaquin 750 mg p.o. daily x3 days. 21. Vasotec 20 mg daily.   DIET: Mechanical soft, no straws.   TIMEFRAME FOR FOLLOW UP: Followup in 1 to 2 weeks with Dr. Lutricia Feil and with Dr. Toy Cookey, primary care doctor, in 7 to 10 days. Check hemoglobin in 7 days to make sure it is not dropping further.   TIME SPENT: 35 minutes. ____________________________ Lacie Scotts Allena Katz, MD shp:slb D: 11/09/2011 11:51:22 ET T: 11/09/2011 12:28:35 ET JOB#: [id number]  cc: Brazos Sandoval H. Allena Katz, MD, <Dictator> Serita Sheller. Maryellen Pile, MD Charise Carwin MD ELECTRONICALLY SIGNED 11/22/2011 15:05

## 2015-03-11 NOTE — H&P (Signed)
PATIENT NAME:  Cameron Garcia, Cameron Garcia MR#:  [id number] DATE OF BIRTH:  [birth year]  DATE OF ADMISSION:  04/25/2012  PRIMARY CARE PHYSICIAN: Toy Cookey, MD (Patient is sent from [facility])  CHIEF COMPLAINT: Altered mental status.  HISTORY OF PRESENT ILLNESS: This is a 74 year old male with a history of dementia and hypertension who presents with the above complaint. The patient is actually nonverbal currently but apparently at baseline he is verbal and is ambulatory. According to information provided by the ER staff, nursing as well as notes from the transfer form, the patient fell and hit his head on 04/22/2012. Since then neuro status has declined. The patient is very lethargic. His blood pressure was elevated on the 3 to 11 shift and 11 to 7 shift. He was given 0.1 mg of clonidine, on 04/24/2012, at 2230. It did not help his blood pressure, so he was sent here for further evaluation. His blood pressure at that time was 220/18 ()with a pulse of 113. He was not alert. He does have falls and with all his ADLs he is pretty much dependent and he has not ambulating since 05/04/2012  Normally he can walk with a walker. In the emergency room, he received labetalol for his elevated blood pressure. He also, according to medication list, is supposed to be on a fentanyl and clonidine patch and they did not find any of these patches here. The patient is tremulous and once again not verbal.  REVIEW OF SYSTEMS: Unable to obtain from the patient due to mental status, as mentioned above.   PAST MEDICAL HISTORY: According to the chart. 1. Encephalopathy secondary to pneumonia. 2. Dementia.  3. Esophageal stricture status post dilation 11/08/2011.  4. Anemia due to chronic blood loss status post EGD.  5. History of coronary artery disease.  6. History of cerebrovascular accident with dysphasia.  7. Protein calorie malnutrition.  8. Hyperlipidemia.  9. Type 2 diabetes. 10. Occlusion and stenosis of carotid artery  status post cerebrovascular accident.   PAST SURGICAL HISTORY: Per old records. 1. Colectomy at [facility].  2. Multiple surgeries for diverticular disease.  3. Colostomy 2009.  4. Reversal of colostomy May 2010. 5. Surgery for Fournier gangrene.   ALLERGIES: Ativan.   MEDICATIONS:  1. Norvasc 10 mg daily.  2. Atorvastatin 20 mg at bedtime.  3. Bethanechol 10 mg t.i.d. for urinary retention.  4. Clonazepam 0.5 mg t.i.d. p.r.n.  5. Clonidine 0.5 mg weekly.  6. Depakote 250 mg b.i.d. for dementia with behaviors.  7. Enalapril 20 mg daily.  8. Fentanyl 25 mcg every 72 hours. 9. Ferrous sulfate 325 mg twice a day.  10. Haldol every 12 hours p.r.n. psychosis.  11. Hydralazine 100 mg t.i.d.  12. Lantus 10 units at bedtime.  13. Magnesium oxide 400 mg b.i.d.  14. Metoprolol 100 mg daily.  15. Nitroglycerin 0.2 mg patch daily.  16. Pantoprazole 40 mg daily.  17. Risperdal 1 mg every 12 hours p.r.n.  18. Tamsulosin 0.4 mg daily.  19. Trazodone 100 mg at bedtime.  20. Tylenol 325 mg 2 tablets every four hours p.r.n. pain.  21. Effexor 75 mg at bedtime.  22. Vitamin D3 1000 international units daily.   FAMILY HISTORY: Unknown.  SOCIAL HISTORY: The patient is a resident at [facility]. He is dependent on ADLs with bathing, dressing, twirling, transfers, ambulation, and eating. He can ambulate with a walker. He also has dysphagia from a previous stroke and has trouble swallowing.   PHYSICAL EXAMINATION:   VITAL  SIGNS: Temperature 98.3, pulse 98, blood pressure 186/84, respirations 18, and 98% on 2 liters.   GENERAL: The patient is sleeping, is nonverbal.   HEENT: Head is atraumatic. Pupils are round and reactive. Sclerae are anicteric. Mucous membranes are dry. Oropharynx clear.  Poor dentition.   NECK: Supple without jugular venous distention or enlarged thyroid.   HEART: Regular rate and rhythm. No murmurs, gallops, or rubs.   LUNGS: Rhonchi bilaterally without crackles, rales, or  wheezing. No dullness to percussion.   BACK: No obvious CVA tenderness.   ABDOMEN: Bowel sounds are positive, nontender and nondistended.   EXTREMITIES: No clubbing, cyanosis, or edema.   NEUROLOGIC: Unable to do neuro examination due to the patient's medical status, however, his Babinski's are downgoing. He is rigid and he is tremulous.   SKIN: No rash or lesions.   LABS/RADIOLOGIC STUDIES: CK 1229. CK-MB 16.4. White blood cells 11.8, hemoglobin 11.3, hematocrit 35, platelets 142, potassium 4.4, chloride 107, bicarbonate 25, BUN 45, creatinine 1.63, glucose 158, calcium 9.3, bilirubin 0.4, alkaline phosphatase 94, ALT 25, AST 54, total protein 7.1, albumin 3.3. INR 1.0. Troponin 0.04.   CT of the head without contrast showed no acute intracranial hemorrhage or cerebrovascular accident.   Chest x-ray shows no acute cardiopulmonary disease.   Urinalysis shows no leukocyte esterase or nitrites.   EKG is normal sinus rhythm without ST elevation or depression.   ASSESSMENT AND PLAN: A 74 year old male who presents w4ith altered mental status. 1. Altered mental status. At this time etiology is uncertain. His CT of the head shows no evidence of intracranial hemorrhage or bleed. He did have a fall a few days ago. He could have hypertensive encephalopathy due to elevated blood pressures. It was noted that he was not on a clonidine patch when he arrived in the hospital. He also was on a fentanyl patch so he could have some withdrawal from opiates. He does not have a source of infection such as pneumonia or urinary tract infection; however, I will hydrate the patient and repeat a chest x-ray in the a.m. to see if he does have a pneumonia. Also he has an elevated CPK and CK, could be possibly secondary to rhabdomyolysis from his fall on 04/22/2012 versus neuroleptic malignant syndrome versus serotonin syndrome. The patient is on Effexor and trazodone. Both of these medications I will hold along with any  antipsychotics and I will go ahead and consult psychiatry for their evaluation on the elevated CK and a chance that this could be related to antipsychotic medications. We will obtain a sitter, obtain neuro checks, and aspiration and fall precautions. It does not appear that he had any bleeding to his head with his recent fall.  2. Elevated CPK and CK. Once again either related to rhabdomyolysis or antipsychotic medications. We will start normal saline at 200 an hour and continue to monitor CK and CPK-MB.  3. Acute renal failure in the setting of rhabdomyolysis and dehydration. We will provide fluids and monitor BMP.  4. Hypertensive encephalopathy. I will restart his outpatient medications. In addition, I have written for hydralazine. We will hold p.o. medications as the patient is not very responsive at this time.  5. Chronic pain. Continue fentanyl patch.   CODE STATUS: The patient is FULL CODE status.   CRITICAL CARE TIME SPENT: 55 minutes.  ____________________________ Janyth ContesSital P. Juliene PinaMody, MD spm:slb D: 04/25/2012 12:19:58 ET     T: 04/25/2012 13:27:20 ET        JOB#: [id number] cc:  Serita Sheller Maryellen Pile, MD Janyth Contes Mikayela Deats MD ELECTRONICALLY SIGNED 04/28/2012 12:27
